# Patient Record
Sex: Female | Born: 1965 | Race: White | Hispanic: No | Smoking: Current every day smoker
Health system: Southern US, Community
[De-identification: ages and names within clinical notes are randomized; demographics above are authoritative.]

---

## 2015-07-17 ENCOUNTER — Emergency Department (INDEPENDENT_AMBULATORY_CARE_PROVIDER_SITE_OTHER): Payer: BLUE CROSS/BLUE SHIELD

## 2015-07-17 ENCOUNTER — Emergency Department (INDEPENDENT_AMBULATORY_CARE_PROVIDER_SITE_OTHER)
Admission: EM | Admit: 2015-07-17 | Discharge: 2015-07-17 | Disposition: A | Discharge status: Home / Self Care | Payer: BLUE CROSS/BLUE SHIELD | Source: Home / Self Care | Attending: Family Medicine | Admitting: Family Medicine

## 2015-07-17 DIAGNOSIS — T148 Other injury of unspecified body region: Secondary | ICD-10-CM | POA: Diagnosis not present

## 2015-07-17 DIAGNOSIS — M79672 Pain in left foot: Secondary | ICD-10-CM

## 2015-07-17 DIAGNOSIS — S0083XA Contusion of other part of head, initial encounter: Secondary | ICD-10-CM | POA: Diagnosis not present

## 2015-07-17 DIAGNOSIS — W1839XA Other fall on same level, initial encounter: Secondary | ICD-10-CM

## 2015-07-17 DIAGNOSIS — M79671 Pain in right foot: Secondary | ICD-10-CM

## 2015-07-17 DIAGNOSIS — S99922A Unspecified injury of left foot, initial encounter: Secondary | ICD-10-CM | POA: Diagnosis not present

## 2015-07-17 DIAGNOSIS — T07XXXA Unspecified multiple injuries, initial encounter: Secondary | ICD-10-CM

## 2015-07-17 DIAGNOSIS — W010XXA Fall on same level from slipping, tripping and stumbling without subsequent striking against object, initial encounter: Secondary | ICD-10-CM

## 2015-07-17 NOTE — ED Provider Notes (Signed)
CSN: [id number]     Arrival date & time 07/17/15  1305 History   First MD Initiated Contact with Patient 07/17/15 1317     Chief Complaint  Patient presents with  . Foot Injury   (Consider location/radiation/quality/duration/timing/severity/associated sxs/prior Treatment) HPI  Pt is a 49yo female presenting to [facility] with c/o Left foot pain and Right second toe pain and bruising secondary to a trip and fall last night. Pt was at a friends' house last night and tripped over a cement slab on back patio causing pt to fall on her knees and hit her chin. Denies LOC. Pt states she bruised her chin and scraped her knees but Left foot is most painful.  Pain in Left foot is aching and sore, 6/10 at worst. Worse with palpation and ambulation.  She has not taken anything for pain today.  History reviewed. No pertinent past medical history. History reviewed. No pertinent past surgical history. No family history on file. Social History  Substance Use Topics  . Smoking status: Current Every Day Smoker  . Smokeless tobacco: None  . Alcohol Use: Yes   OB History    No data available     Review of Systems  Musculoskeletal: Positive for myalgias, joint swelling and arthralgias.       Right 2nd toe, Left foot  Skin: Positive for color change and wound.  Neurological: Negative for weakness and numbness.    Allergies  Review of patient's allergies indicates no known allergies.  Home Medications   Prior to Admission medications   Not on File   Meds Ordered and Administered this Visit  Medications - No data to display  BP 161/78 mmHg  Pulse 110  Temp(Src) 97.9 F (36.6 C) (Oral)  Ht  (1.651 m)  Wt 165 lb (74.844 kg)  BMI 27.46 kg/m2  SpO2 98%  LMP 05/16/2015 (Approximate) No data found.   Physical Exam  Constitutional: She is oriented to person, place, and time. She appears well-developed and well-nourished.  HENT:  Head: Normocephalic. Head is with contusion.    Mouth/Throat:  Uvula is midline, oropharynx is clear and moist and mucous membranes are normal. No trismus in the jaw.  Mild contusion to chin. Skin in tact. No dental injury  Eyes: EOM are normal.  Neck: Normal range of motion.  Cardiovascular: Tachycardia present.   Pulses:      Dorsalis pedis pulses are 2+ on the right side, and 2+ on the left side.  Mild tachycardia Cap refill in toes of Left and Right foot: <3 seconds  Pulmonary/Chest: Effort normal.  Musculoskeletal: Normal range of motion. She exhibits edema and tenderness.  Left and Right knees: mild edema, full ROM. Non-tender.  Calves are soft, non-tender. Right foot: 2nd toe- mild edema with tenderness. Full ROM with increased pain on movement. Left foot: full ROM. Tenderness to dorsum of foot.  Neurological: She is alert and oriented to person, place, and time.  Skin: Skin is warm and dry.  Left and Right knee: small abrasions. No bleeding. Left foot: abrasion to medial aspect Left great toe and base of Left great toe. No active bleeding. Mild ecchymosis to dorsal of foot. Right foot: 2nd toe-ecchymosis.  Psychiatric: She has a normal mood and affect. Her behavior is normal.  Nursing note and vitals reviewed.   ED Course  Procedures (including critical care time)  Labs Review Labs Reviewed - No data to display  Imaging Review Dg Foot Complete Left  07/17/2015   CLINICAL DATA:  Fall, left foot injury  EXAM: LEFT FOOT - COMPLETE 3+ VIEW  COMPARISON:  None.  FINDINGS: No fracture or dislocation is seen.  The joint spaces are preserved.  The visualized soft tissues are unremarkable.  IMPRESSION: No fracture or dislocation is seen.   Electronically Signed   By: Charline Bills M.D.   On: 07/17/2015 14:04   Dg Foot Complete Right  07/17/2015   CLINICAL DATA:  Tripped, fall last night, injuring right foot. Injury to second toe.  EXAM: RIGHT FOOT COMPLETE - 3+ VIEW  COMPARISON:  None.  FINDINGS: There is no evidence of fracture or  dislocation. There is no evidence of arthropathy or other focal bone abnormality. Soft tissues are unremarkable.  IMPRESSION: Negative.   Electronically Signed   By: Charlett Nose M.D.   On: 07/17/2015 14:04       MDM   1. Fall from slip, trip, or stumble, initial encounter   2. Injury of second toe, left, initial encounter   3. Chin contusion, initial encounter   4. Multiple abrasions   5. Foot injury, left, initial encounter     Pt c/o Left foot pain and Right 2nd toe pain after trip and fall last night. Pt has multiple abrasions and contusion of her chin. Denies LOC. No dental injury. Plain films: negative for fracture. Will tx for contusions. Discussed buddy taping of toes. Pt declined in UC setting.  Info for R.I.C.E therapy and buddy taping toes at home provided. F/u with PCP in 1 week if not improving. Patient verbalized understanding and agreement with treatment plan.     Junius Finner, PA-C 07/17/15 1415

## 2015-07-17 NOTE — ED Notes (Signed)
Patient fell and injured her left foot, tripped over a cement slab, inside of arch area seems to be most painful, also injured her right second toe

## 2016-06-24 IMAGING — CR DG FOOT COMPLETE 3+V*L*
3 series · 3 of 3 positions shown · non-contrast
Comparison: None.

CLINICAL DATA: Fall, left foot injury

EXAM:
LEFT FOOT - COMPLETE 3+ VIEW

[foot ap]
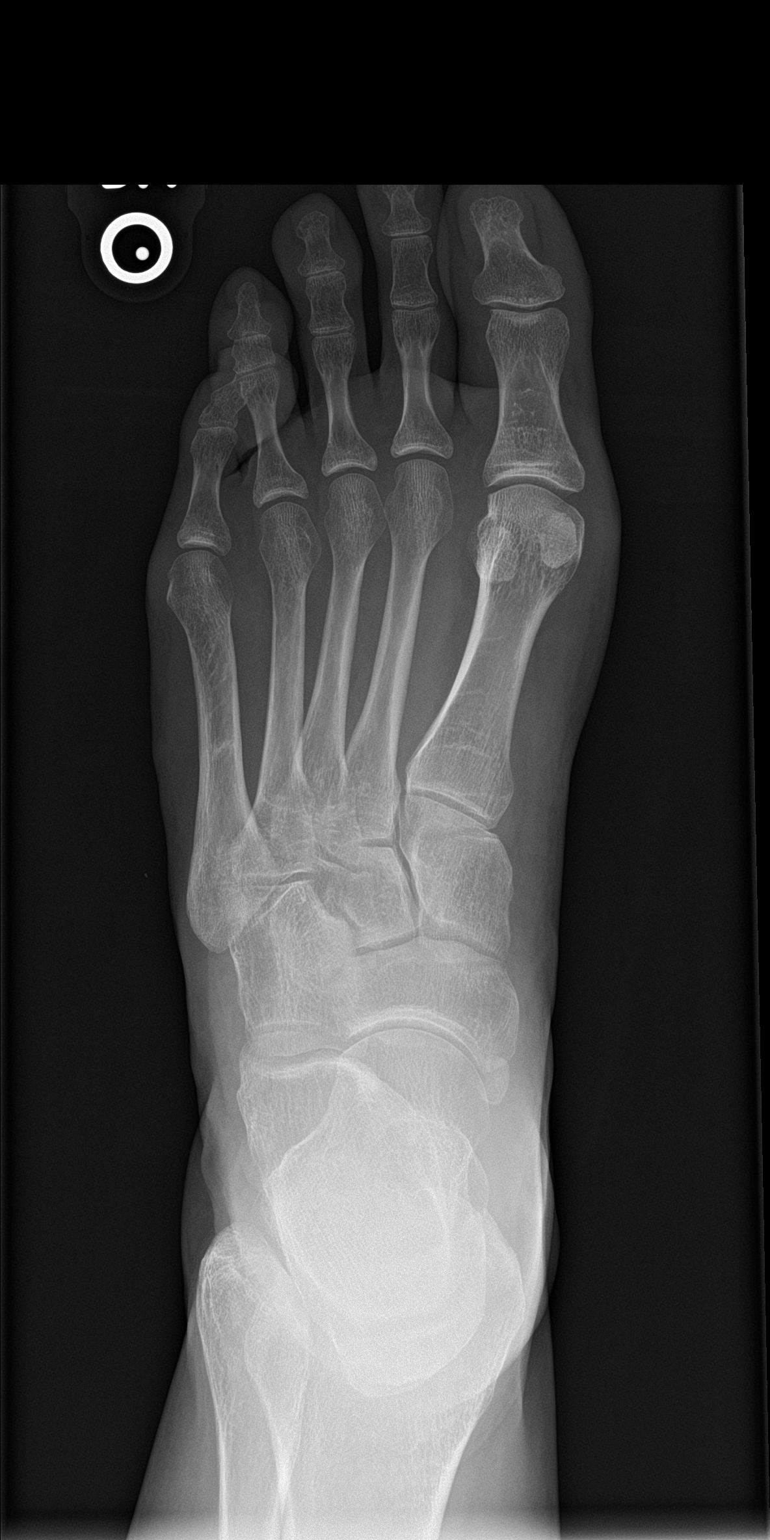

[foot obl]
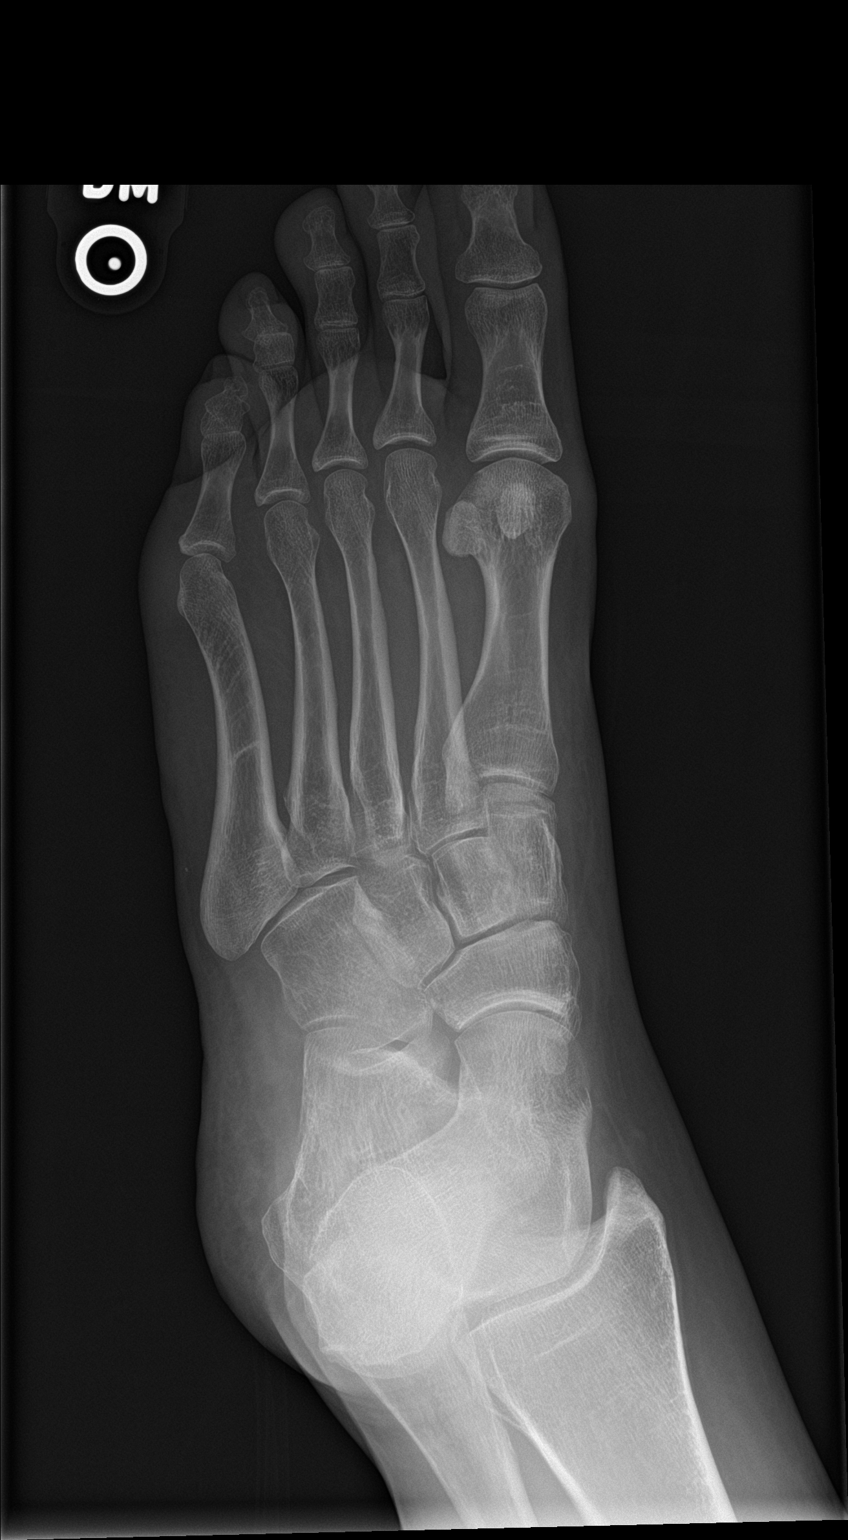

[foot lat]
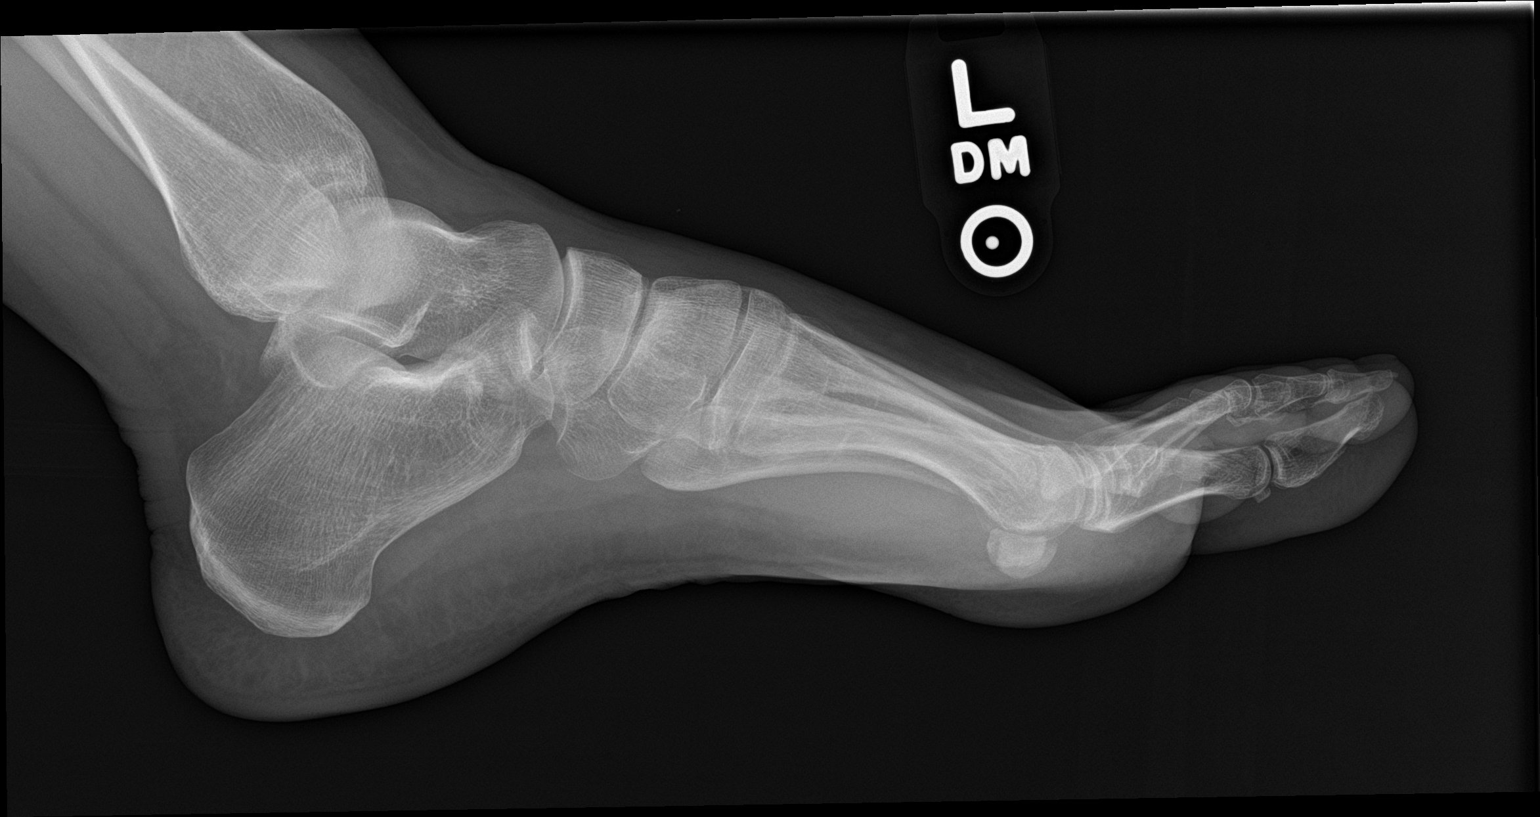

[3 of 3 positions shown; findings below may reference images not displayed]

FINDINGS: No fracture or dislocation is seen.

The joint spaces are preserved.

The visualized soft tissues are unremarkable.
IMPRESSION: No fracture or dislocation is seen.
